# Patient Record
Sex: Male | Born: 1960
Health system: Southern US, Community
[De-identification: ages and names within clinical notes are randomized; demographics above are authoritative.]

## PROBLEM LIST (undated history)

## (undated) DIAGNOSIS — K5792 Diverticulitis of intestine, part unspecified, without perforation or abscess without bleeding: Secondary | ICD-10-CM

## (undated) HISTORY — PX: COLOSTOMY: SHX63

## (undated) HISTORY — PX: TONSILLECTOMY: SUR1361

## (undated) HISTORY — PX: HERNIA REPAIR: SHX51

---

## 2003-06-25 ENCOUNTER — Inpatient Hospital Stay (HOSPITAL_COMMUNITY): Admission: AD | Admit: 2003-06-25 | Discharge: 2003-07-06 | Payer: Self-pay | Admitting: General Surgery

## 2003-06-25 ENCOUNTER — Encounter: Payer: Self-pay | Admitting: General Surgery

## 2003-07-31 ENCOUNTER — Emergency Department (HOSPITAL_COMMUNITY): Admission: EM | Admit: 2003-07-31 | Discharge: 2003-07-31 | Payer: Self-pay | Admitting: *Deleted

## 2003-08-03 ENCOUNTER — Encounter: Payer: Self-pay | Admitting: Urology

## 2003-08-03 ENCOUNTER — Ambulatory Visit (HOSPITAL_COMMUNITY): Admission: RE | Admit: 2003-08-03 | Discharge: 2003-08-03 | Payer: Self-pay | Admitting: Urology

## 2003-08-05 ENCOUNTER — Emergency Department (HOSPITAL_COMMUNITY): Admission: EM | Admit: 2003-08-05 | Discharge: 2003-08-05 | Payer: Self-pay | Admitting: Emergency Medicine

## 2003-08-08 ENCOUNTER — Emergency Department (HOSPITAL_COMMUNITY): Admission: EM | Admit: 2003-08-08 | Discharge: 2003-08-08 | Payer: Self-pay | Admitting: Emergency Medicine

## 2003-08-18 ENCOUNTER — Emergency Department (HOSPITAL_COMMUNITY): Admission: EM | Admit: 2003-08-18 | Discharge: 2003-08-18 | Payer: Self-pay | Admitting: Emergency Medicine

## 2006-06-12 ENCOUNTER — Emergency Department (HOSPITAL_COMMUNITY): Admission: EM | Admit: 2006-06-12 | Discharge: 2006-06-12 | Payer: Self-pay | Admitting: Emergency Medicine

## 2006-11-15 ENCOUNTER — Ambulatory Visit (HOSPITAL_COMMUNITY): Admission: RE | Admit: 2006-11-15 | Discharge: 2006-11-15 | Payer: Self-pay | Admitting: Family Medicine

## 2007-11-21 ENCOUNTER — Inpatient Hospital Stay (HOSPITAL_COMMUNITY): Admission: EM | Admit: 2007-11-21 | Discharge: 2007-11-23 | Payer: Self-pay | Admitting: Emergency Medicine

## 2007-11-21 ENCOUNTER — Ambulatory Visit (HOSPITAL_COMMUNITY): Admission: RE | Admit: 2007-11-21 | Discharge: 2007-11-21 | Payer: Self-pay | Admitting: Family Medicine

## 2011-03-17 NOTE — Discharge Summary (Signed)
Scott Garrison, Scott Garrison             ACCOUNT NO.:  [id number]   MEDICAL RECORD NO.:  [id number]          PATIENT TYPE:  INP   LOCATION:  A320                          FACILITY:  [facility]   PHYSICIAN:  Tilford Pillar, MD      DATE OF BIRTH:  May 11, 1961   DATE OF ADMISSION:  11/21/2007  DATE OF DISCHARGE:  01/21/2009LH                               DISCHARGE SUMMARY   ADMISSION DIAGNOSIS:  Partial small bowel obstruction.   DISCHARGE DIAGNOSES:  1. Status post partial small bowel obstruction.  2. History of diverticulosis/diverticulitis.  3. History of Hartmann's colostomy.   SURGEON:  Tilford Pillar, MD.   PROCEDURE:  None.   DISPOSITION:  Home.   HISTORY/PHYSICAL:  Please see the admission history and physical for the  complete H&P.  The patient is a 50 year old male with a history of a  previous sigmoid colectomy for what appeared to be an episode of  diverticulitis.  He has an end colostomy which he has had for several  years.  He has increasing nausea and vomiting prior to admission with  increasing abdominal pain consistent with a small bowel obstruction.  As  patient has had continued flatus through his ostomy, this was determined  to be likely a partial small bowel obstruction.  He was admitted for  planned management.   HOSPITAL COURSE:  The patient was admitted on November 21, 2007.  Conservative management was initiated with bowel rest, IV fluid  hydration.  NG tube was not placed initially.  However, it was discussed  with the patient that should his nausea, vomiting and abdominal pain  continue, that this may be beneficial.  However during his course, he  continued to have improvement with resolution of his abdominal pain and  with resolution of his nausea and vomiting.  Again, he had increasing  stooling from his ostomy.  On November 23, 2007, he was tolerating a  regular diet, having bowel function and was comfortable.  Plans were  made for discharge.   DISCHARGE  INSTRUCTIONS:  1. The patient is to resume all activities at home.  There are no      restrictions.  2. He is to continue with standard ostomy care.  3. He is to follow up in my office in 2 weeks for post-hospitalization      check.   DISCHARGE MEDICATIONS:  The patient is to resume all previously  prescribed home medications including MiraLax as needed for  constipation.      Tilford Pillar, MD  Electronically Signed     BZ/MEDQ  D:  11/23/2007  T:  11/23/2007  Job:  [id number]   cc:   Tilford Pillar, MD  Fax: [phone]

## 2011-03-17 NOTE — H&P (Signed)
NAMEWASHINGTON, Scott Garrison             ACCOUNT NO.:  [id number]   MEDICAL RECORD NO.:  [id number]          PATIENT TYPE:  INP   LOCATION:  A320                          FACILITY:  [facility]   PHYSICIAN:  Tilford Pillar, MD      DATE OF BIRTH:  1961/04/12   DATE OF ADMISSION:  11/21/2007  DATE OF DISCHARGE:  LH                              HISTORY & PHYSICAL   CHIEF COMPLAINT:  Diffuse abdominal pain and nausea and vomiting.   HISTORY OF PRESENT ILLNESS:  The patient is a 50 year old male with  history of prior diverticulitis, prior sigmoid colectomy with end  colostomy who presents with approximately five days of increasing  abdominal distention, nausea, vomiting, and abdominal pain diffuse in  nature and colicky in its characteristics.  No significant relieving or  exacerbating features noted.  He denies any hematemesis.  He denies any  melena or hematochezia prior to the onset.  He has not noticed any  bulging or hernias.  He has denied any fever or chills.  To note patient  did state he had a couple oranges prior to the onset of all his  symptoms.  He is stating that approximately several years ago he had a  similar blockage after eating oranges, and patient feels that this may  be contributing.  Also to note the patient has had a colonoscopy within  the last five years which was negative at the time of colonoscopy for  any neoplasms or any premalignant changes.   PAST MEDICAL HISTORY:  Diverticulosis, diverticulitis.   PAST SURGICAL HISTORY:  Open sigmoid colectomy with Hartmann's  procedure.   MEDICATIONS:  MiraLax.   ALLERGIES:  No known drug allergies.   SOCIAL HISTORY:  No tobacco abuse, no alcohol use, no recreational drug  use.   REVIEW OF SYSTEMS:  No headaches, no weight changes, no shortness of  breath, no chest pain, no extremity edema.  Abdominal pain as per HPI.  SKIN:  No rashes.   PHYSICAL EXAMINATION:  VITAL SIGNS:  Were reviewed.  Temperature 98.6,  heart rate  80, respirations 18.  GENERAL APPEARANCE:  The patient was initially __________  and  encountered in the hall initially ambulatory in no acute distress.  He  comfortably is able to sit on his bed.  He is alert and oriented x3.  HEENT:  Pupils are equal, round, reactive.  Extraocular movements are  intact.  No conjunctival pallor is noted.  NECK:  Trachea is midline, no cervical lymphadenopathy appreciated.  PULMONARY:  Unlabored respirations clear to auscultation in bilateral  lung fields.  CARDIOVASCULAR:  Regular rate and rhythm.  No murmurs, gallops or rubs  are appreciated.  He has 2+ radial pulses bilaterally, 2+ dorsalis pedis  pulses bilaterally.  ABDOMEN:  He does have positive bowel sounds, abdomen is soft.  While  standing he was somewhat protuberant, but flat lying supine.  No tympany  is encountered.  He has no tenderness to palpation.  Abdomen does have a  midline incisional scar.  On the superior aspect of this there is a  small hernia defect, but no incarcerated  tissue is noted within.  He  also has an ostomy with an ostomy appliance secured.  The ostomy is  pink.  There is some gas initially upon removing the ostomy appliance.  EXTREMITIES:  Warm and dry.   PERTINENT LABORATORY DATA/RADIOGRAPHIC STUDIES:  CBC:  The patient has a  white blood cell count of 7.5, hemoglobin 15.  Basic metabolic panel is  within normal limits as was magnesium phosphorus levels.   CT evaluation of the abdomen and pelvis demonstrates no free air or free  fluid.  There are moderately dilated loops of small intestine.  No  discrete transition point is evident.   ASSESSMENT/PLAN:  A partial small bowel obstruction.  As patient has had  continued flatus and resolution of symptoms with initial bowel rest, it  is likely that this will resolve with continued conservative management  as he currently has an __________ increasing amount of flatus.  At this  point I will advance him to a clear diet.   I will discuss with the  patient should he develop any abdominal pain or nausea that he should  cease any additional oral intake.  Likely progressions were discussed  with the patient including resolution with conservative management  versus a prolonged course or worsening of his symptoms which would  require an operation.  Signs, symptoms, and indications of operation was  discussed with the patient.  He understands that this probably will  continue with planned conservative management.  He is encouraged to  ambulate.  IV fluid was not continued, and he was started on clear  liquids as tolerated.  This may be advanced to full liquids if he  continues to tolerate, and continues to have __________ bowel function  __________  were discussed with the patient that advancing to a  __________ diet beyond __________  will require actual demonstration of  bowel movements through his ostomy.  The patient understands this and is  comfortable with planned course.      Tilford Pillar, MD  Electronically Signed     BZ/MEDQ  D:  11/22/2007  T:  11/22/2007  Job:  [id number]

## 2011-03-20 NOTE — Discharge Summary (Signed)
Scott Garrison, Scott Garrison                         ACCOUNT NO.:  [id number]   MEDICAL RECORD NO.:  [id number]                  PATIENT TYPE:   LOCATION:                                       FACILITY:   PHYSICIAN:  Barbaraann Barthel, M.D.              DATE OF BIRTH:   DATE OF ADMISSION:  DATE OF DISCHARGE:                                 DISCHARGE SUMMARY   ADDENDUM:  This patient's intraoperative complications were discussed  thoroughly with the patient and with his family, and all understood the  nature of his surgical problems.     ___________________________________________                                         Barbaraann Barthel, M.D.   WB/MEDQ  D:  10/01/2003  T:  10/01/2003  Job:  [id number]

## 2011-03-20 NOTE — H&P (Signed)
   NAME:  Scott Garrison, Scott Garrison                       ACCOUNT NO.:  [id number]   MEDICAL RECORD NO.:  [id number]                   PATIENT TYPE:  AMB   LOCATION:  DAY                                  FACILITY:  [facility]   PHYSICIAN:  Ky Barban, M.D.            DATE OF BIRTH:  [birth year]   DATE OF ADMISSION:  08/02/2003  DATE OF DISCHARGE:                                HISTORY & PHYSICAL   CHIEF COMPLAINT:  Recurrent urinary retention.   HISTORY:  [age]-year-old gentleman underwent repair of ureteral injury  on June 27, 2003.  Her was having Hartmann pouch removed and the ureteral  injury was recognized which was repaired by Dr. Rito Ehrlich.  Twice he has been  given voiding trial and he is unable to void.  He told me he was voiding  fine the night before then next day he came to the emergency room with  retention, we placed a Foley catheter, sent him back to his home.  I have  told him that we need to do cystoscopy retrograde urethrogram and see what  is going on.  He understands and wanted me to go ahead and proceed.  For his  remaining history and physical refer to his old chart.   PHYSICAL EXAMINATION:  VITAL SIGNS:  Blood pressure 130/80, temperature is  normal.  CENTRAL NERVOUS SYSTEM:  Negative.  HEAD/NECK/EYE/ENT:  Negative.  CHEST:  Clear and equal.  HEART:  Regular sinus rhythm.  ABDOMEN:  Abdomen soft, flat.  Liver, spleen, and kidneys are not palpable.  EXTERNAL GENITALIA:  He has Foley catheter in place.  Testicles are normal.  He has a nonpermanent colostomy.   IMPRESSION:  Recurrent urinary retention.   PLAN:  Retrograde urethrogram and cystoscopy, possible optical urethrotomy  under anesthesia as outpatient.     ___________________________________________                                         Ky Barban, M.D.   MIJ/MEDQ  D:  08/02/2003  T:  08/02/2003  Job:  [id number]

## 2011-03-20 NOTE — Op Note (Signed)
   NAMEKORT, STETTLER                       ACCOUNT NO.:  [id number]   MEDICAL RECORD NO.:  [id number]                   PATIENT TYPE:  INP   LOCATION:  IC09                                 FACILITY:  [facility]   PHYSICIAN:  Dennie Maizes, M.D.                DATE OF BIRTH:  Jun 12, 1961   DATE OF PROCEDURE:  06/27/2003  DATE OF DISCHARGE:                                 OPERATIVE REPORT   PREOPERATIVE DIAGNOSIS:  Urethral injury during instrumentation of  Hartmann's pouch.   POSTOPERATIVE DIAGNOSIS:  Urethral injury during instrumentation of  Hartmann's pouch.   PROCEDURE:  Repair of urethral injury.   SURGEON:  Dennie Maizes, M.D.   ANESTHESIA:  General   COMPLICATIONS:  None   DRAINS:  16 French Foley catheter in the bladder.   INDICATIONS FOR PROCEDURE:  This 50 year old male was scheduled to undergo  reversal of colostomy by Dr. Malvin Johns. During instrumentation of the  Hartmann's pouch, a rectal injury was sustained. Dr. Malvin Johns decided to do  excision of the Hartmann's pouch.  Urethral injury was recognized during  mobilization of the rectum and was repaired.   DESCRIPTION OF PROCEDURE:  Please refer to Dr. Daisy Blossom operative notes  for the details of the excision of the Hartmann's pouch. Through the hernial  incision Dr. Malvin Johns mobilized the rectum.  During this process he noticed  a urethral injury and he could see the Foley catheter.  I was asked to  repair the urethral injury.   The perineal incision was inspected.  The catheter could be palpated through  a small laceration in the bulbous urethra.  The perineal wound was then  retracted and under vision, a tear in the bulbous urethra about 1 cm in  length was noted. This was a transverse tear in the bulbous urethra  involving the ventral wall of the urethra.  The dorsal wall of the urethra  was found to be intact.  With adequate exposure the bulbous urethra was seen  and the edges of  the urethra were  approximated using 4-0 chromic gut.  The Foley catheter was  then irrigated and there was no leak of urine through the bladder up through  the urethral tear. Dr. Malvin Johns then proceeded with the rest of the  operation.   It is planned to keep the Foley catheter for about 2 weeks for adequate  healing of the urethral injury.                                               Dennie Maizes, M.D.    SK/MEDQ  D:  06/27/2003  T:  06/28/2003  Job:  [id number]   cc:   Barbaraann Barthel, M.D.  [address]  [place]  [place] [address]  Fax: [phone]

## 2011-03-20 NOTE — H&P (Signed)
NAMEJEYSON, DESHOTEL                       ACCOUNT NO.:  [id number]   MEDICAL RECORD NO.:  [id number]                   PATIENT TYPE:  INP   LOCATION:  A305                                 FACILITY:  [facility]   PHYSICIAN:  Barbaraann Barthel, M.D.              DATE OF BIRTH:  [birth year]   DATE OF ADMISSION:  06/25/2003  DATE OF DISCHARGE:                                HISTORY & PHYSICAL   NOTE:  This is a 50 year old white male who underwent a Hartmann procedure  for a perforated diverticular disease in August 2001.  He presents now with  a stenotic colostomy and with desire for colostomy reversal.  He has been  somewhat lost to follow up as he had not kept his appointments as scheduled.  His colostomy has been functioning well, however, it is very stenotic and  hardly allows the passage of the small finger.  He has had no other  problems, however, since this time.  His wound, however, does show that he  has an incisional hernia that has healed without the sign of infection.  We  had planned for an elective colon resection and primary anastomosis with  takedown with his end colostomy.   We discussed the procedure, in detail, with the physical therapy including  complications not limited to but including bleeding, infection, and  anastomotic leaks.  Informed consent was obtained.   PAST MEDICAL HISTORY:  Past medical history is fairly unremarkable.   MEDICATIONS:  He takes no medications on a regular basis.   ALLERGIES:  He has no known allergies.   PAST SURGICAL HISTORY:  He had a T&A done in childhood; and, as stated, he  had an emergency laparotomy with a Hartmann procedure in August 2001.  The  patient is a nonsmoker and a nondrinker.   PHYSICAL EXAMINATION:  VITAL SIGNS:  He is 5 feet 10 inches and weighs 234-  1/2 pounds.  His temperature is 97.8; heart rate is 68; respirations are 20;  blood pressure 122/84.  HEENT:  Head is normocephalic.  Eyes extraocular movements are  intact.  Pupils are round and react to light and accommodation.  There is no  conjunctivae pallor or scleral injection. The sclerae have a normal  tincture.  Nose and oral mucosa are moist.  There are no bruits auscultated  and no adenopathy.  CHEST:  Clear to both anterior and posterior auscultation.  HEART:  Regular rhythm.  ABDOMEN:  Soft. The patient has an incisional hernia in the midline from his  previous surgery.  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  GENITOURINARY:  He has no groin hernias and genitalia is within normal  limits.  RECTAL:  Rectal stump will be evaluated during the time of surgery.  He has  had no complaints in that area.  EXTREMITIES:  Within normal limits.   REVIEW OF  SYSTEMS:  NEUROLOGIC SYSTEM: No history of migraines or seizure.  ENDOCRINE SYSTEM:  No history of diabetes or thyroid disease.  CARDIOPULMONARY SYSTEM: Within normal limits.  MUSCULOSKELETAL SYSTEM: The  patient is somewhat obese.  GI SYSTEM: No past history of hepatitis,  constipation, diarrhea, or bright red rectal bleeding, or any past history  of melena or inflammatory bowel disease, and no unexplained weight loss.  The patient has a history of diverticular disease which required emergency  surgery and a Hartmann procedure performed in 2001.  GU SYSTEM: No history  of dysuria, hematuria, or history of nephrolithiasis.   ADMITTING DIAGNOSES:  1. Status post Hartmann procedure for diverticular disease.  2. Incisional hernia.   PLAN:  We will admit this patient as he is somewhat unreliable regarding his  care to the hospital, and it is somewhat difficult to perform a prep with  him because of the stenosis of his colostomy.  We will do an in hospital  mechanical and antibiotic prep with plans for surgery during this  hospitalization.  During this hospitalization we will also ask Dr. Nobie Putnam  who is his family physician to follow him  medically.                                               Barbaraann Barthel, M.D.    WB/MEDQ  D:  06/25/2003  T:  06/25/2003  Job:  [id number]   cc:   Patrica Duel, M.D.  [address]  [place]  [place] [address]  Fax: [phone]

## 2011-03-20 NOTE — Op Note (Signed)
Scott Garrison, Scott Garrison                       ACCOUNT NO.:  [id number]   MEDICAL RECORD NO.:  [id number]                   PATIENT TYPE:  INP   LOCATION:  A305                                 FACILITY:  [facility]   PHYSICIAN:  Barbaraann Barthel, M.D.              DATE OF BIRTH:  06-20-1961   DATE OF PROCEDURE:  06/27/2003  DATE OF DISCHARGE:                                 OPERATIVE REPORT   PREOPERATIVE DIAGNOSES:  1. Status post Hartmann procedure for diverticular disease.  2. Incisional hernia.   POSTOPERATIVE DIAGNOSES:  1. Status post Hartmann procedure for diverticular disease.  2. Incisional hernia.   PROCEDURE:  1. Abdominal perineal resection with colostomy revision.  2. Repair of urethral tear.  3. Repair of incisional hernia.  4. Failed attempt at [facility] procedure closure.   SURGEON:  Barbaraann Barthel, M.D.   ASSISTANT:  Dennie Maizes, M.D.   COMPLICATIONS:  Urethral tear and low posterior rectal tear.   NOTE:  This is a 50 year old white male who presented to me 3 years ago in  extremis with perforated diverticular disease.  He had a very low  perforation below the sacral promontory. He was treated with a Hartmann  procedure at that time and we made plans for a reversal; however, he was  somewhat lost to follow up as he did not keep his follow up appointments. I  saw him in my office and he stated that his insurance was changing and he  would like to get this fixed prior to an insurance change.  He was also  noted to have a very stenotic colostomy as well as a ventral hernia.  We  discussed the procedure in detail with him, discussing the complications,  not limited to, but including: Bleeding, infection, and anastomotic leaks.  We also discussed the possibility that we may not be able to put him back  together.  An informed consent was obtained.   GROSS OPERATIVE FINDINGS:  The patient had a rectal stump just below the  level of the sacral promontory.  A  stenotic colostomy which we were able to  free up the left colon enough to migrate down into the area of the pelvis to  attempt an EEA anastomosis.  The patient also had an incisional hernia and  multiple adhesions.   SPECIMENS:  1. Left colon with perianal area.  2. Colostomy revision tissue.   DESCRIPTION OF PROCEDURE:  The patient was placed in the supine position and  after the adequate administration of general anesthesia by endotracheal  intubation his entire abdomen was prepped with Betadine solution and draped  in the usual manner; and a Foley catheter was aseptically inserted.  After  prepping and draping I excised an elliptical incision around the colostomy  and then stapled across the colostomy stump and sent this as a specimen.  Then, placed a sterile OpSite over the area after prepping this and then  entire rest of the abdomen was then prepped and draped in the usual manner.  A midline incision was carried out from the xiphoid down to the symphysis  pubis opening the abdomen in the midline being careful not to injure any  bowel due to the fact that there was a considerable incisional hernia  present.   After dissecting free we were able to identify the rectal stump and this was  freed up by elevating it somewhat; however, it was still below the sigmoid  promontory.  We then dissected tediously to the left colon, freed up the  splenic flexure in order to bring the left colon down into the pelvis to  attempt an anastomosis.  We then placed the EEA stapler through the rectum  and unfortunately the EEA stapler tore posteriorly and very low just above  the dentate line tearing the posterior rectum substantially.  We then, could  not suture this closed and I made the decision to revise his colostomy and  then remove the small rectal stump area.  This was done; and in the process  of the perineal portion of the procedure, making an elliptical incision  around the anal area there  was a small tear made in the urethra.  This was  repaired by the urologist Dr. Rito Ehrlich.  For details of this see his  operative note.   We did freed up the rectum.  I then removed that portion of the specimen  through the perineal incision and then placed a Jackson-Pratt drain in the  perineal area and then closed this with 4-0 Nylon and sutured the Al Pimple drain which had been placed through a separate stab wound incision to  the left buttock area.   The incisional hernia was closed with #1 Prolene suture, suturing the  midline with figure-of-eight sutures and then closing the skin with a  stapling device and placing a sterile OpSite dressing over the wound.   The colostomy was then matured prior to closing the abdomen.  It had been  tacked circumferentially to the peritoneum with 3-0 silk sutures and then we  matured the colostomy with 4-0 chromic and then placed a colostomy bag over  this.   Prior to closure all sponge, needle, and instrument counts were found to be  correct.  Estimated blood loss was approximately 600 cc; and the patient  received 6 liters of crystalloids and 500 cc of Hespan intraoperatively.  Follow up H&H's revealed an H&H intraoperatively of 14 and 41; further  follow up H&H's will be made.  Prior to closure all sponge, needle, and  instrument counts were found to be correct.  Estimated blood loss was as  mentioned above.  The patient was taken to the recovery room in satisfactory  condition.   I discussed the operation in detail with all family members and discussed  briefly with the patient in the recovery room that we were unable to  reanastomose him.                                               Barbaraann Barthel, M.D.    WB/MEDQ  D:  06/27/2003  T:  06/27/2003  Job:  [id number]   cc:   Dennie Maizes, M.D.  [address]  [place]  [place] [address]  Fax: [phone]   Patrica Duel, M.D. Sallyanne Havers Sena[address] Ores  [address]  [place]  [place]  [address]  Fax: [phone]

## 2011-03-20 NOTE — Discharge Summary (Signed)
Scott Garrison, SAUCEDA                       ACCOUNT NO.:  [id number]   MEDICAL RECORD NO.:  [id number]                   PATIENT TYPE:  INP   LOCATION:  A219                                 FACILITY:  [facility]   PHYSICIAN:  Barbaraann Barthel, M.D.              DATE OF BIRTH:  07-16-1961   DATE OF ADMISSION:  06/25/2003  DATE OF DISCHARGE:  07/06/2003                                 DISCHARGE SUMMARY   DIAGNOSES:  1. Status post Hartmann's procedure.  2. Incisional hernia.  3. Obesity.  4. Stenosis of colostomy.   PROCEDURE ATTEMPTED:  1. Sigmoid-low anterior anastomosis, status post Hartmann's procedure.  2. Abdominoperineal resection.  3. Repair of urethral tear.  4. Incisional herniorrhaphy.   PROCEDURE:  This procedure was done on June 27, 2003.   CONSULTATIONS:  Dr. Dennie Maizes, urology department.   NOTE:  This is a 50 year old white male who I saw in extremis with  perforated diverticular disease and underwent a Hartmann's procedure in  August of 2001.  At that time, he had a perforated sigmoid colon and an  intra-abdominal abscess.  He had been somewhat lost to followup and returned  with a stenotic colostomy and with plans for reversal; it was also noted  that he had an incisional hernia.   He was admitted to the hospital prior to surgery for his mechanical and  antibiotic prep; this was deemed necessary as the patient was somewhat  reliable; also, mechanically this was difficult due to a stenotic colostomy.  He went to surgery on  June 27, 2003, at which time we tried to perform a  reversal of his Hartmann's; this was very difficult due to the patient's  body habitus and the fact that he had a quite low perforation and in trying  to do so, we were unable to perform an EEA anastomosis; it caused a rectal  tear just in the posterior aspect through just above the dentate line. We  were unable to perform this anastomosis and an abdominoperineal resection  was  carried out.  He also during this had a urethral tear which was  recognized and repaired primarily by the urology service.  This was a very  difficult surgery and the patient was postoperatively placed in the  intensive care unit, where he did fairly well.  He did require a transfusion  of two units of packed red blood cells and he also had a minor wound  dehiscence which did not involve the fascial layer; this was taken care of  in the intensive care unit.  The patient postoperatively did well, apart  from some anxiety within the intensive care unit, however, he did quite well  and he was discharged from the intensive care unit on about the 5th  postoperative day.  At that time, his perineal drain was removed, his  perineal incision was clean and we had no further problems with his  abdominal incision.  His GI tract returned to function with good colostomy  function and his colostomy and stoma site remained in good shape and viable  and he was tolerating p.o. well and he remained with his Foley in place, to  be followed by the urology service postoperatively.  At the time of  discharge, his Foley was draining clear urine, his wound was clean without  any signs of infection, either on the perineal site or the abdominal site,  and he was tolerating p.o. well and was having no shortness of breath or  other problems.   LABORATORY DATA:  The patient's hemoglobin and hematocrit remained stable  after transfusion with a hemoglobin and hematocrit of 12.4 and 36.1 on  July 05, 2003.  His white count on July 03, 2003 was 13.2.  His  electrolytes were grossly within normal limits.   His chest x-ray done on June 25, 2003 was normal.  He did have an EKG  which showed some sinus bradycardia postoperatively, however, he developed  no cardiac problems and was followed by the medical service perioperatively.   DISCHARGE INSTRUCTIONS:  The patient is excused from work.  He is discharged  on a  full liquid and soft diet.  He is told to increase his activities as  tolerated.  He is permitted to shower.  He is told to do no driving, no  heavy lifting or sexual activity in the immediate postoperative period.  He  is told to use Karaya powder around the colostomy site as needed and MiraLax  17 g daily as needed for his stools.  He is told to clean his wounds with  alcohol three times a day and is given a prescription for Darvocet-N 100 one  tab every four hours as needed for pain and he is to follow up with Dr.  Rito Ehrlich for his Foley catheter and he was to follow up with me  perioperatively.  We will be following this patient closely postoperatively.     ___________________________________________                                         Barbaraann Barthel, M.D.   WB/MEDQ  D:  10/01/2003  T:  10/01/2003  Job:  [id number]   cc:   Dennie Maizes, M.D.  [address]  [place]  [place] [address]  Fax: [phone]   Patrica Duel, M.D.  [address]  [place]  [place] [address]  Fax: [phone]

## 2011-03-20 NOTE — Op Note (Signed)
   NAME:  Scott Garrison, Scott Garrison                       ACCOUNT NO.:  [id number]   MEDICAL RECORD NO.:  [id number]                   PATIENT TYPE:  AMB   LOCATION:  DAY                                  FACILITY:  [facility]   PHYSICIAN:  Ky Barban, M.D.            DATE OF BIRTH:  09-Sep-1961   DATE OF PROCEDURE:  DATE OF DISCHARGE:                                 OPERATIVE REPORT   PREOPERATIVE DIAGNOSIS:  Urinary retention, rule out urethral stricture.   POSTOPERATIVE DIAGNOSIS:  Questionable urethral stricture and urethritis.   PROCEDURE:  Retrograde urethrogram, cystoscopy and dilation.   ANESTHESIA:  General.   DESCRIPTION OF PROCEDURE:  The patient was given general endotracheal  anesthesia, placed in lithotomy position, __________ prep and drape. A  solution of Hypaque and KY jelly was injected with the help of a Toomey  syringe into the urethra and the fluoroscopic urethrogram is done. The dye  goes through the bulbous urethra and I do not see any significant  obstruction or narrowing in that area. I could not take oblique picture. The  patient is already in lithotomy position. I was satisfied with the  urethrogram so I did not try to do an oblique picture. The #35 cystoscope  was introduced under direct vision. The bulbous urethra where the  urethrotomy happened and a repair was done. That area appeared slightly  inflamed and irregular, otherwise, my cystoscope goes through it without any  obstruction. The prostatic urethra looks fine, bladder looks fine. The  cystoscope was removed, the urethra was further dilated to a 85 [place]  without any problem. I did not see any definite stricture but it does appear  that area of the bulbous urethra is slightly irregular and may be tight but  I was able to dilate up to 28 [place] without any problem. I did not see that  he needed a urethrotomy. I left a #18 silicone catheter. The patient left  the operating room in satisfactory  condition.      ___________________________________________                                            Ky Barban, M.D.   MIJ/MEDQ  D:  08/03/2003  T:  08/03/2003  Job:  [id number]   cc:   Barbaraann Barthel, M.D.  [address]  [place]  [place] [address]  Fax: [phone]

## 2011-07-23 LAB — MAGNESIUM: Magnesium: 2

## 2011-07-23 LAB — APTT: aPTT: 26

## 2011-07-23 LAB — BASIC METABOLIC PANEL
BUN: 12
BUN: 14
CO2: 25
CO2: 26
Calcium: 10.4
Calcium: 9.2
Chloride: 103
Chloride: 105
Creatinine, Ser: 1.21
Creatinine, Ser: 1.21
GFR calc Af Amer: >60
GFR calc Af Amer: >60
GFR calc non Af Amer: >60
GFR calc non Af Amer: >60
Glucose, Bld: 108 — ABNORMAL HIGH
Glucose, Bld: 93
Potassium: 3.9
Potassium: 4
Sodium: 134 — ABNORMAL LOW
Sodium: 138

## 2011-07-23 LAB — DIFFERENTIAL
Basophils Absolute: 0
Basophils Absolute: 0
Basophils Relative: 0
Basophils Relative: 1
Eosinophils Absolute: 0
Eosinophils Absolute: 0.1
Eosinophils Relative: 0
Eosinophils Relative: 1
Lymphocytes Relative: 10 — ABNORMAL LOW
Lymphocytes Relative: 5 — ABNORMAL LOW
Lymphs Abs: 0.5 — ABNORMAL LOW
Lymphs Abs: 0.7
Monocytes Absolute: 0.7
Monocytes Absolute: 0.8
Monocytes Relative: 11
Monocytes Relative: 7
Neutro Abs: 5.8
Neutro Abs: 9.5 — ABNORMAL HIGH
Neutrophils Relative %: 78 — ABNORMAL HIGH
Neutrophils Relative %: 88 — ABNORMAL HIGH

## 2011-07-23 LAB — CBC
HCT: 45.9
HCT: 49.1
Hemoglobin: 15.3
Hemoglobin: 16.4
MCHC: 33.2
MCHC: 33.4
MCV: 100.2 — ABNORMAL HIGH
MCV: 99.8
Platelets: 223
Platelets: 238
RBC: 4.59
RBC: 4.92
RDW: 12.7
RDW: 12.7
WBC: 10.7 — ABNORMAL HIGH
WBC: 7.5

## 2011-07-23 LAB — PHOSPHORUS: Phosphorus: 3.5

## 2011-07-23 LAB — PROTIME-INR
INR: 1
Prothrombin Time: 13.4

## 2016-05-12 ENCOUNTER — Emergency Department (HOSPITAL_COMMUNITY)
Admission: EM | Admit: 2016-05-12 | Discharge: 2016-05-12 | Disposition: A | Discharge status: Home / Self Care | Payer: Medicare Other | Attending: Emergency Medicine | Admitting: Emergency Medicine

## 2016-05-12 ENCOUNTER — Emergency Department (HOSPITAL_COMMUNITY): Payer: Medicare Other

## 2016-05-12 ENCOUNTER — Encounter (HOSPITAL_COMMUNITY): Payer: Self-pay | Admitting: Emergency Medicine

## 2016-05-12 DIAGNOSIS — Y929 Unspecified place or not applicable: Secondary | ICD-10-CM | POA: Diagnosis not present

## 2016-05-12 DIAGNOSIS — Y999 Unspecified external cause status: Secondary | ICD-10-CM | POA: Diagnosis not present

## 2016-05-12 DIAGNOSIS — R109 Unspecified abdominal pain: Secondary | ICD-10-CM | POA: Insufficient documentation

## 2016-05-12 DIAGNOSIS — Y939 Activity, unspecified: Secondary | ICD-10-CM | POA: Insufficient documentation

## 2016-05-12 HISTORY — DX: Diverticulitis of intestine, part unspecified, without perforation or abscess without bleeding: K57.92

## 2016-05-12 LAB — BASIC METABOLIC PANEL
Anion gap: 6 (ref 5–15)
BUN: 13 mg/dL (ref 6–20)
CO2: 26 mmol/L (ref 22–32)
Calcium: 9.8 mg/dL (ref 8.9–10.3)
Chloride: 105 mmol/L (ref 101–111)
Creatinine, Ser: 0.99 mg/dL (ref 0.61–1.24)
GFR calc Af Amer: >60 mL/min (ref >60)
GFR calc non Af Amer: >60 mL/min (ref >60)
Glucose, Bld: 117 mg/dL — ABNORMAL HIGH (ref 65–99)
Potassium: 3.8 mmol/L (ref 3.5–5.1)
Sodium: 137 mmol/L (ref 135–145)

## 2016-05-12 LAB — CBC WITH DIFFERENTIAL/PLATELET
Basophils Absolute: 0 10*3/uL (ref 0.0–0.1)
Basophils Relative: 1 %
Eosinophils Absolute: 0.1 10*3/uL (ref 0.0–0.7)
Eosinophils Relative: 2 %
HCT: 45.3 % (ref 39.0–52.0)
Hemoglobin: 15.7 g/dL (ref 13.0–17.0)
Lymphocytes Relative: 7 %
Lymphs Abs: 0.6 10*3/uL — ABNORMAL LOW (ref 0.7–4.0)
MCH: 34.1 pg — ABNORMAL HIGH (ref 26.0–34.0)
MCHC: 34.7 g/dL (ref 30.0–36.0)
MCV: 98.5 fL (ref 78.0–100.0)
Monocytes Absolute: 0.9 10*3/uL (ref 0.1–1.0)
Monocytes Relative: 11 %
Neutro Abs: 6.5 10*3/uL (ref 1.7–7.7)
Neutrophils Relative %: 79 %
Platelets: 216 10*3/uL (ref 150–400)
RBC: 4.6 MIL/uL (ref 4.22–5.81)
RDW: 12.7 % (ref 11.5–15.5)
WBC: 8.1 10*3/uL (ref 4.0–10.5)

## 2016-05-12 MED ORDER — SODIUM CHLORIDE 0.9 % IV BOLUS (SEPSIS)
500.0000 mL | Freq: Once | INTRAVENOUS | Status: AC
  Administered 2016-05-12: 500 mL via INTRAVENOUS

## 2016-05-12 MED ORDER — DIATRIZOATE MEGLUMINE & SODIUM 66-10 % PO SOLN
ORAL | Status: AC
  Filled 2016-05-12: qty 30

## 2016-05-12 MED ORDER — IOPAMIDOL (ISOVUE-300) INJECTION 61%
100.0000 mL | Freq: Once | INTRAVENOUS | Status: AC | PRN
Start: 2016-05-12 — End: 2016-05-12
  Administered 2016-05-12: 100 mL via INTRAVENOUS
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
## 2016-05-12 NOTE — Discharge Instructions (Signed)
Take Tylenol or Motrin for pain and follow-up if any problems

## 2016-06-20 ENCOUNTER — Emergency Department (HOSPITAL_COMMUNITY)
Admission: EM | Admit: 2016-06-20 | Discharge: 2016-06-20 | Disposition: A | Discharge status: Home / Self Care | Payer: Medicare Other | Attending: Emergency Medicine | Admitting: Emergency Medicine

## 2016-06-20 ENCOUNTER — Encounter (HOSPITAL_COMMUNITY): Payer: Self-pay | Admitting: *Deleted

## 2016-06-20 DIAGNOSIS — Y939 Activity, unspecified: Secondary | ICD-10-CM | POA: Insufficient documentation

## 2016-06-20 DIAGNOSIS — M549 Dorsalgia, unspecified: Secondary | ICD-10-CM | POA: Diagnosis present

## 2016-06-20 DIAGNOSIS — Y92410 Unspecified street and highway as the place of occurrence of the external cause: Secondary | ICD-10-CM | POA: Insufficient documentation

## 2016-06-20 DIAGNOSIS — S46911A Strain of unspecified muscle, fascia and tendon at shoulder and upper arm level, right arm, initial encounter: Secondary | ICD-10-CM | POA: Diagnosis not present

## 2016-06-20 DIAGNOSIS — Y999 Unspecified external cause status: Secondary | ICD-10-CM | POA: Insufficient documentation

## 2016-06-20 MED ORDER — PREDNISONE 20 MG PO TABS
40.0000 mg | ORAL_TABLET | Freq: Once | ORAL | Status: AC
  Administered 2016-06-20: 40 mg via ORAL
  Filled 2016-06-20: qty 2

## 2016-06-20 MED ORDER — CYCLOBENZAPRINE HCL 10 MG PO TABS: 10.0000 mg | ORAL_TABLET | Freq: Three times a day (TID) | ORAL | 0 refills | Status: DC

## 2016-06-20 MED ORDER — TRAMADOL HCL 50 MG PO TABS
100.0000 mg | ORAL_TABLET | Freq: Once | ORAL | Status: AC
  Administered 2016-06-20: 100 mg via ORAL
  Filled 2016-06-20: qty 2

## 2016-06-20 MED ORDER — CYCLOBENZAPRINE HCL 10 MG PO TABS
10.0000 mg | ORAL_TABLET | Freq: Once | ORAL | Status: AC
  Administered 2016-06-20: 10 mg via ORAL
  Filled 2016-06-20: qty 1

## 2016-06-20 MED ORDER — TRAMADOL HCL 50 MG PO TABS: 50.0000 mg | ORAL_TABLET | Freq: Four times a day (QID) | ORAL | 0 refills | Status: DC | PRN

## 2016-06-20 NOTE — Discharge Instructions (Signed)
Your vital signs within normal limits. Your examination favors strain of your right shoulder. Please use Flexeril 3 times daily, and use Ultram for pain not improved by Tylenol. Please see Dr. Romeo AppleHarrison in the office as soon as possible for orthopedic evaluation of your shoulder.
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
## 2016-06-20 NOTE — ED Notes (Signed)
Hobson, PA at bedside.  
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
## 2016-06-20 NOTE — ED Notes (Signed)
Pt made aware to return if symptoms worsen or if any life threatening symptoms occur.   

## 2016-06-22 ENCOUNTER — Telehealth: Payer: Self-pay | Admitting: Orthopaedic Surgery

## 2016-06-22 NOTE — Telephone Encounter (Signed)
Patient called following Emergency Room visit at [facility] for right shoulder pain/strain; states no Xray was done. Offered first available appointment

## 2016-06-23 ENCOUNTER — Ambulatory Visit (INDEPENDENT_AMBULATORY_CARE_PROVIDER_SITE_OTHER): Payer: Medicare Other

## 2016-06-23 ENCOUNTER — Encounter: Payer: Self-pay | Admitting: Orthopaedic Surgery

## 2016-06-23 ENCOUNTER — Ambulatory Visit (INDEPENDENT_AMBULATORY_CARE_PROVIDER_SITE_OTHER): Payer: Medicare Other | Admitting: Orthopaedic Surgery

## 2016-06-23 VITALS — BP 128/81 | HR 70 | Temp 97.7°F | Ht 69.0 in | Wt 228.0 lb

## 2016-06-23 DIAGNOSIS — M25511 Pain in right shoulder: Secondary | ICD-10-CM
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
## 2016-07-07 ENCOUNTER — Ambulatory Visit: Payer: Medicare Other | Admitting: Orthopaedic Surgery
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
## 2016-11-09 ENCOUNTER — Encounter (HOSPITAL_COMMUNITY): Payer: Self-pay | Admitting: Emergency Medicine

## 2016-11-09 ENCOUNTER — Emergency Department (HOSPITAL_COMMUNITY)
Admission: EM | Admit: 2016-11-09 | Discharge: 2016-11-09 | Disposition: A | Discharge status: Home / Self Care | Payer: Medicare Other | Attending: Emergency Medicine | Admitting: Emergency Medicine

## 2016-11-09 DIAGNOSIS — K9400 Colostomy complication, unspecified: Secondary | ICD-10-CM | POA: Insufficient documentation

## 2016-11-09 DIAGNOSIS — IMO0002 Reserved for concepts with insufficient information to code with codable children: Secondary | ICD-10-CM

## 2016-11-09 DIAGNOSIS — Z4801 Encounter for change or removal of surgical wound dressing: Secondary | ICD-10-CM | POA: Diagnosis present

## 2016-11-09 LAB — I-STAT CHEM 8, ED
BUN: 13 mg/dL (ref 6–20)
Calcium, Ion: 1.2 mmol/L (ref 1.15–1.40)
Chloride: 102 mmol/L (ref 101–111)
Creatinine, Ser: 1 mg/dL (ref 0.61–1.24)
Glucose, Bld: 101 mg/dL — ABNORMAL HIGH (ref 65–99)
HCT: 50 % (ref 39.0–52.0)
Hemoglobin: 17 g/dL (ref 13.0–17.0)
Potassium: 4.3 mmol/L (ref 3.5–5.1)
Sodium: 140 mmol/L (ref 135–145)
TCO2: 25 mmol/L (ref 0–100)

## 2016-11-09 NOTE — ED Triage Notes (Signed)
PT c/o irritation/redness to colostomy stoma and skin surrounding area x2 days. PT denies any abdominal pain.

## 2016-11-09 NOTE — ED Provider Notes (Signed)
[facility]-EMERGENCY DEPT Provider Note   CSN: [id number] Arrival date & time: 11/09/16  1041   By signing my name below, I, Cynda Acres, attest that this documentation has been prepared under the direction and in the presence of Burgess Amor, PA-C. Electronically Signed: Cynda Acres, Scribe. 11/09/16. 12:42 PM.  History   Chief Complaint Chief Complaint  Patient presents with  . Wound Check    HPI Comments: Scott Garrison is a 56 y.o. male with a hx of diverticulitis with 16 year history with a colostomy, complaining of irritation/redness to his colostomy stoma. Patient states he has intermittent episodes where he has frequent colostomy output, yesterday he had to change his colostomy bag 15 times.  When he has these episodes, he tends to rub the colostomy site causing irritation of the stoma.  He denies blood in his stool, but was somewhat startled by the amount of bleeding he saw from the stoma mucosa yesterday, although it is better controlled today.  He describes his stool as soft, not watery. No modifying factors indicated. He does not currently have a pcp, was a patient of Dr. Malvin Johns before he retired.    The history is provided by the patient. No language interpreter was used.    Past Medical History:  Diagnosis Date  . Diverticulitis     There are no active problems to display for this patient.   Past Surgical History:  Procedure Laterality Date  . COLOSTOMY    . HERNIA REPAIR    . TONSILLECTOMY         Home Medications    Prior to Admission medications   Medication Sig Start Date End Date Taking? Authorizing Provider  cyclobenzaprine (FLEXERIL) 10 MG tablet Take 1 tablet (10 mg total) by mouth 3 (three) times daily. Patient not taking: Reported on 11/09/2016 06/20/16   Ivery Quale, PA-C  traMADol (ULTRAM) 50 MG tablet Take 1 tablet (50 mg total) by mouth every 6 (six) hours as needed. Patient not taking: Reported on 11/09/2016 06/20/16   Ivery Quale, PA-C     Family History History reviewed. No pertinent family history.  Social History Social History  Substance Use Topics  . Smoking status: Never Smoker  . Smokeless tobacco: Never Used  . Alcohol use Yes     Comment: socially     Allergies   Patient has no known allergies.   Review of Systems Review of Systems   Physical Exam Updated Vital Signs BP 120/78 (BP Location: Left Arm)   Pulse 85   Temp 98.1 F (36.7 C) (Oral)   Resp 20   Ht 5\' 10"  (1.778 m)   Wt 93.9 kg   SpO2 100%   BMI 29.70 kg/m   Physical Exam  Constitutional: He is oriented to person, place, and time. He appears well-developed and well-nourished.  HENT:  Head: Normocephalic and atraumatic.  Eyes: Conjunctivae and EOM are normal. Pupils are equal, round, and reactive to light.  Neck: Normal range of motion. Neck supple.  Cardiovascular: Normal rate.   Pulmonary/Chest: Effort normal.  Abdominal: Soft. He exhibits no distension. There is no tenderness. There is no rebound and no guarding.  His stoma is erythematous with several areas of erythema and friable areas that look like have been recently bleeding.  There are few traces of bright red blood along the bag wall, otherwise empty of stool.   Musculoskeletal: Normal range of motion.  Neurological: He is alert and oriented to person, place, and time.  Skin: Skin  is warm and dry.  Psychiatric: He has a normal mood and affect.  Nursing note and vitals reviewed.    ED Treatments / Results  DIAGNOSTIC STUDIES: Oxygen Saturation is 100% on RA, normal by my interpretation.    COORDINATION OF CARE: 12:42 PM Discussed treatment plan with pt at bedside and pt agreed to plan.  Labs (all labs ordered are listed, but only abnormal results are displayed) Labs Reviewed  I-STAT CHEM 8, ED - Abnormal; Notable for the following:       Result Value   Glucose, Bld 101 (*)    All other components within normal limits    EKG  EKG Interpretation None        Radiology No results found.  Procedures Procedures (including critical care time)  Medications Ordered in ED Medications - No data to display   Initial Impression / Assessment and Plan / ED Course  I have reviewed the triage vital signs and the nursing notes.  Pertinent labs & imaging results that were available during my care of the patient were reviewed by me and considered in my medical decision making (see chart for details).  Clinical Course     Labs reviewed,  No electolyte abnormality and no anemia from blood loss.  Pt with irritated but otherwise healthy appearing stoma.  He reports he has a prescription "powder" he can use for irritation of the stoma, but has not used (unsure of name).  Pt was given referrals to pcp and also surgery prn.  Also advised return here for any new or worsened bleeding.  The patient appears reasonably screened and/or stabilized for discharge and I doubt any other medical condition or other [facility] requiring further screening, evaluation, or treatment in the ED at this time prior to discharge.   Final Clinical Impressions(s) / ED Diagnoses   Final diagnoses:  Complication of external stoma of gastrointestinal tract    New Prescriptions Discharge Medication List as of 11/09/2016  1:07 PM     I personally performed the services described in this documentation, which was scribed in my presence. The recorded information has been reviewed and is accurate.    Burgess AmorJulie Airika Alkhatib, PA-C 11/11/16 1127    Raeford RazorStephen Kohut, MD 11/13/16 [phone]

## 2017-05-16 ENCOUNTER — Emergency Department (HOSPITAL_COMMUNITY)
Admission: EM | Admit: 2017-05-16 | Discharge: 2017-05-16 | Disposition: A | Discharge status: Home / Self Care | Payer: Medicare Other | Attending: Emergency Medicine | Admitting: Emergency Medicine

## 2017-05-16 ENCOUNTER — Encounter (HOSPITAL_COMMUNITY): Payer: Self-pay | Admitting: Emergency Medicine

## 2017-05-16 DIAGNOSIS — R1110 Vomiting, unspecified: Secondary | ICD-10-CM | POA: Diagnosis present

## 2017-05-16 DIAGNOSIS — R109 Unspecified abdominal pain: Secondary | ICD-10-CM | POA: Diagnosis not present

## 2017-05-16 LAB — CBC WITH DIFFERENTIAL/PLATELET
Basophils Absolute: 0 10*3/uL (ref 0.0–0.1)
Basophils Relative: 0 %
Eosinophils Absolute: 0 10*3/uL (ref 0.0–0.7)
Eosinophils Relative: 1 %
HCT: 43 % (ref 39.0–52.0)
Hemoglobin: 15.3 g/dL (ref 13.0–17.0)
Lymphocytes Relative: 7 %
Lymphs Abs: 0.4 10*3/uL — ABNORMAL LOW (ref 0.7–4.0)
MCH: 35.1 pg — ABNORMAL HIGH (ref 26.0–34.0)
MCHC: 35.6 g/dL (ref 30.0–36.0)
MCV: 98.6 fL (ref 78.0–100.0)
Monocytes Absolute: 0.5 10*3/uL (ref 0.1–1.0)
Monocytes Relative: 8 %
Neutro Abs: 4.7 10*3/uL (ref 1.7–7.7)
Neutrophils Relative %: 84 %
Platelets: 183 10*3/uL (ref 150–400)
RBC: 4.36 MIL/uL (ref 4.22–5.81)
RDW: 12.4 % (ref 11.5–15.5)
WBC: 5.6 10*3/uL (ref 4.0–10.5)

## 2017-05-16 LAB — COMPREHENSIVE METABOLIC PANEL
ALT: 68 U/L — ABNORMAL HIGH (ref 17–63)
AST: 108 U/L — ABNORMAL HIGH (ref 15–41)
Albumin: 4.1 g/dL (ref 3.5–5.0)
Alkaline Phosphatase: 95 U/L (ref 38–126)
Anion gap: 11 (ref 5–15)
BUN: 9 mg/dL (ref 6–20)
CO2: 20 mmol/L — ABNORMAL LOW (ref 22–32)
Calcium: 9.5 mg/dL (ref 8.9–10.3)
Chloride: 106 mmol/L (ref 101–111)
Creatinine, Ser: 0.89 mg/dL (ref 0.61–1.24)
GFR calc Af Amer: >60 mL/min (ref >60)
GFR calc non Af Amer: >60 mL/min (ref >60)
Glucose, Bld: 117 mg/dL — ABNORMAL HIGH (ref 65–99)
Potassium: 3.7 mmol/L (ref 3.5–5.1)
Sodium: 137 mmol/L (ref 135–145)
Total Bilirubin: 1.9 mg/dL — ABNORMAL HIGH (ref 0.3–1.2)
Total Protein: 8.2 g/dL — ABNORMAL HIGH (ref 6.5–8.1)

## 2017-05-16 LAB — CK: Total CK: 195 U/L (ref 49–397)

## 2017-05-16 MED ORDER — ONDANSETRON 4 MG PO TBDP: 4.0000 mg | ORAL_TABLET | Freq: Three times a day (TID) | ORAL | 0 refills | Status: DC | PRN

## 2017-05-16 MED ORDER — SODIUM CHLORIDE 0.9 % IV BOLUS (SEPSIS)
1000.0000 mL | Freq: Once | INTRAVENOUS | Status: AC
  Administered 2017-05-16: 1000 mL via INTRAVENOUS

## 2017-05-16 MED ORDER — LORAZEPAM 2 MG/ML IJ SOLN
1.0000 mg | Freq: Once | INTRAMUSCULAR | Status: AC
  Administered 2017-05-16: 1 mg via INTRAVENOUS
  Filled 2017-05-16: qty 1

## 2017-05-16 NOTE — ED Triage Notes (Signed)
Pt reports working outside for several hours yesterday and drinking soda to rehydrate. Last night started having abd pain and has had two episodes of V/. States he has abd pain and shakiness to hands.

## 2017-05-16 NOTE — Discharge Instructions (Signed)
Rest, increase fluids, prescription for nausea.

## 2017-05-18 NOTE — ED Provider Notes (Signed)
[facility]-EMERGENCY DEPT Provider Note   CSN: [id number] Arrival date & time: 05/16/17  1204     History   Chief Complaint Chief Complaint  Patient presents with  . Emesis    HPI Scott Garrison is a 56 y.o. male.  patient presents with a concern about dehydration. He was working in the hot sun yesterday and did not drink enough fluids. He felt overheated. He was unable to eat much last night. He is urinating. No fever, sweats, chills, chest pain, dyspnea, mental status changes. Severity of symptoms is moderate. Review of systems positive for looseness of right central incisor.      Past Medical History:  Diagnosis Date  . Diverticulitis     There are no active problems to display for this patient.   Past Surgical History:  Procedure Laterality Date  . COLOSTOMY    . HERNIA REPAIR    . TONSILLECTOMY         Home Medications    Prior to Admission medications   Medication Sig Start Date End Date Taking? Authorizing Provider  cyclobenzaprine (FLEXERIL) 10 MG tablet Take 1 tablet (10 mg total) by mouth 3 (three) times daily. Patient not taking: Reported on 11/09/2016 06/20/16   Ivery Quale, PA-C  ondansetron (ZOFRAN ODT) 4 MG disintegrating tablet Take 1 tablet (4 mg total) by mouth every 8 (eight) hours as needed for nausea or vomiting. 05/16/17   Donnetta Hutching, MD  ondansetron (ZOFRAN ODT) 4 MG disintegrating tablet Take 1 tablet (4 mg total) by mouth every 8 (eight) hours as needed for nausea or vomiting. 05/16/17   Donnetta Hutching, MD  traMADol (ULTRAM) 50 MG tablet Take 1 tablet (50 mg total) by mouth every 6 (six) hours as needed. Patient not taking: Reported on 11/09/2016 06/20/16   Ivery Quale, PA-C    Family History History reviewed. No pertinent family history.  Social History Social History  Substance Use Topics  . Smoking status: Never Smoker  . Smokeless tobacco: Never Used  . Alcohol use Yes     Comment: socially     Allergies   Patient has no known  allergies.   Review of Systems Review of Systems  All other systems reviewed and are negative.    Physical Exam Updated Vital Signs BP 128/78 (BP Location: Right Arm)   Pulse 68   Temp 97.8 F (36.6 C) (Oral)   Resp 18   Ht 5\' 10"  (1.778 m)   Wt 86.2 kg (190 lb)   SpO2 98%   BMI 27.26 kg/m   Physical Exam  Constitutional: He is oriented to person, place, and time.  Appears dehydrated  HENT:  Head: Normocephalic and atraumatic.  Right central incisor loose  Eyes: Conjunctivae are normal.  Neck: Neck supple.  Cardiovascular: Normal rate and regular rhythm.   Pulmonary/Chest: Effort normal and breath sounds normal.  Abdominal: Soft. Bowel sounds are normal.  Musculoskeletal: Normal range of motion.  Neurological: He is alert and oriented to person, place, and time.  Skin: Skin is warm and dry.  Psychiatric: He has a normal mood and affect. His behavior is normal.  Nursing note and vitals reviewed.    ED Treatments / Results  Labs (all labs ordered are listed, but only abnormal results are displayed) Labs Reviewed  CBC WITH DIFFERENTIAL/PLATELET - Abnormal; Notable for the following:       Result Value   MCH 35.1 (*)    Lymphs Abs 0.4 (*)    All other components within  normal limits  COMPREHENSIVE METABOLIC PANEL - Abnormal; Notable for the following:    CO2 20 (*)    Glucose, Bld 117 (*)    Total Protein 8.2 (*)    AST 108 (*)    ALT 68 (*)    Total Bilirubin 1.9 (*)    All other components within normal limits  CK    EKG  EKG Interpretation None       Radiology No results found.  Procedures Procedures (including critical care time)  Medications Ordered in ED Medications  sodium chloride 0.9 % bolus 1,000 mL (0 mLs Intravenous Stopped 05/16/17 1454)  sodium chloride 0.9 % bolus 1,000 mL (0 mLs Intravenous Stopped 05/16/17 1454)  LORazepam (ATIVAN) injection 1 mg (1 mg Intravenous Given 05/16/17 1324)     Initial Impression / Assessment and  Plan / ED Course  I have reviewed the triage vital signs and the nursing notes.  Pertinent labs & imaging results that were available during my care of the patient were reviewed by me and considered in my medical decision making (see chart for details).     I suspect patient is suffering from mild heat exhaustion. He feels better after IV hydration. CPK was normal. Discharge medications Zofran 4 mg  Final Clinical Impressions(s) / ED Diagnoses   Final diagnoses:  Abdominal pain, unspecified abdominal location    New Prescriptions Discharge Medication List as of 05/16/2017  2:38 PM    START taking these medications   Details  ondansetron (ZOFRAN ODT) 4 MG disintegrating tablet Take 1 tablet (4 mg total) by mouth every 8 (eight) hours as needed for nausea or vomiting., Starting Sun 05/16/2017, Print         Donnetta Hutchingook, Damarea Merkel, MD 05/18/17 [phone]

## 2017-09-30 ENCOUNTER — Ambulatory Visit (INDEPENDENT_AMBULATORY_CARE_PROVIDER_SITE_OTHER): Payer: Medicare Other | Admitting: Psychology

## 2017-09-30 ENCOUNTER — Encounter (HOSPITAL_COMMUNITY): Payer: Self-pay | Admitting: Psychology

## 2017-09-30 DIAGNOSIS — F1020 Alcohol dependence, uncomplicated: Secondary | ICD-10-CM | POA: Insufficient documentation
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
## 2018-05-10 ENCOUNTER — Ambulatory Visit: Payer: Medicare HMO | Admitting: General Surgery

## 2018-05-10 ENCOUNTER — Encounter: Payer: Self-pay | Admitting: General Surgery

## 2018-05-10 VITALS — BP 126/78 | HR 72 | Temp 97.7°F | Ht 71.0 in | Wt 211.0 lb

## 2018-05-10 DIAGNOSIS — Z933 Colostomy status: Secondary | ICD-10-CM

## 2018-05-10 NOTE — Patient Instructions (Signed)
Colostomy Home Guide, Adult A colostomy is a surgical procedure to make an opening (stoma) for stool (feces) to leave your body. This surgery is done when a medical condition prevents stool from leaving your body through the end of the large intestine (rectum). During the surgery, part of the large intestine (colon) is attached to the stoma that is made in the front of your abdomen. A bag (pouch) is fitted over the stoma. Stool and gas will collect in the bag. After having this surgery, you will need to empty and change your colostomy bag as needed. You will also need to care for the stoma. How do I care for my stoma? Your stoma should look pink, red, and moist, like the inside of your cheek. At first, the stoma may be swollen, but this swelling will go away within 6 weeks. To care for the stoma:  Keep the skin around the stoma clean and dry.  Use a clean, soft washcloth to gently wash the stoma and the skin around it. ? Use warm water and only use cleansers recommended by your health care provider. ? Rinse the stoma area with plain water. ? Dry the area well.  Use stoma powder or ointment on your skin only as told by your health care provider. Do not use any other powders, gels, wipes, or creams on your skin.  Change your colostomy bag if your skin becomes irritated. Irritation may indicate that the bag is leaking.  Check your stoma area every day for signs of infection. Check for: ? More redness, swelling, or pain. ? More fluid or blood. ? Pus or warmth.  Measure the stoma opening regularly and record the size. Watch for changes. Share this information with your health care provider.  How do I care for my colostomy bag? The bag that fits over the stoma can have either one or two pieces.  One-piece bag: The skin barrier and the bag are combined in a single unit.  Two-piece bag: The skin barrier and the bag are separate pieces that attach to each other.  Empty your bag at bedtime and  whenever it is one-third to one-half full. Do not let more stool or gas build up. This could cause the bag to leak. Some colostomy bags have a built-in gas release valve. Change the bag every 3-4 days or as told by your health care provider. Also change the bag if it is leaking or separating from the skin or your skin looks irritated. How do I empty my colostomy bag? Before you leave the hospital, you will be taught how to empty your bag. Follow these basic steps: 1. Wash your hands with soap and water. 2. Sit far back on the toilet. 3. Put several pieces of toilet paper into the toilet water. This will prevent splashing as you empty the stool into the toilet. 4. Remove the clip or the velcro from the tail end of the bag. 5. Unroll the tail, then empty stool into the toilet. 6. Clean the tail with toilet paper. 7. Reroll the tail, and close it with the clip or velcro. 8. Wash your hands again.  How do I change my colostomy bag? Before you leave the hospital, you will be taught how to change your bag. Always have colostomy supplies with you, and follow these basic steps: 1. Wash your hands with soap and water. Have paper towels or tissues near you to clean any discharge. 2. Use a template to pre-cut the skin barrier. Smooth  any rough edges. 3. If using a two-piece bag, attach the bag and the skin barrier to each other. Add the barrier ring, if you use one. 4. If your stools are watery, add a few cotton balls to the new bag to absorb the liquid. 5. Remove the old bag and skin barrier. Gently push the skin away from the barrier with your fingers or a warm cloth. 6. Wash your hands again. Then clean the stoma area as directed with water or with mild soap and water. Use water to rinse away any soap. 7. Dry the skin. You may use the cool setting on a hair dryer to do this. 8. If directed, apply stoma powder or skin barrier gel to the skin. 9. Dry the skin again. 10. Warm the skin barrier with your  hands or a warm compress. 11. Remove the paper from the sticky (adhesive) strip of the skin barrier. 12. Press the adhesive strip onto the skin around the stoma. 13. Gently rub the skin barrier onto the skin. This creates heat that helps the barrier to stick. 14. Apply stoma tape to the edges of the skin barrier.  What are some general tips?  Avoid wearing clothes that are tight directly over your stoma.  You may shower or bathe with the colostomy bag on or off. Do not use harsh or oily soaps or lotions. Dry the skin and bag after bathing.  Store all supplies in a cool, dry place. Do not leave supplies in extreme heat because parts can melt.  Whenever you leave home, take an extra skin barrier and colostomy bag with you.  If your colostomy bag gets wet, you can dry it with a hair dryer on the cool setting.  To prevent odor, put drops of ostomy deodorizer in the colostomy bag. Your health care provider may also recommend putting ostomy lubricant inside the bag. This helps the stool to slide out of the bag more easily and completely. Contact a health care provider if:  You have more redness, swelling, or pain around your stoma.  You have more fluid or blood coming from your stoma.  Your stoma feels warm to the touch.  You have pus coming from your stoma.  Your stoma extends in or out farther than normal.  You need to change the bag every day.  You have a fever. Get help right away if:  Your stool is bloody.  You vomit.  You have trouble breathing. This information is not intended to replace advice given to you by your health care provider. Make sure you discuss any questions you have with your health care provider. Document Released: 10/22/2003 Document Revised: 02/27/2016 Document Reviewed: 02/21/2014 Elsevier Interactive Patient Education  [employer].

## 2018-05-11 NOTE — Progress Notes (Signed)
Scott HeadingJeffrey R Garrison; [id number]; 1961/04/02   HPI Patient is a 57 year old white male who referred himself to my care for reevaluation of his abdominal wall hernias and colostomy.  In the remote past, he underwent a Hartman's procedure by another surgeon for diverticulitis.  At the second surgery, there was an attempt to reverse the colostomy, but this cannot be done.  Thus, he has a permanent colostomy.  He is also developed multiple incisional hernias on his abdominal wall.  He does not have a primary care physician.  He denies any abdominal pain.  His colostomy is working well.  He needed to be evaluated as he needs a prescription for his colostomy supplies. Past Medical History:  Diagnosis Date  . Diverticulitis     Past Surgical History:  Procedure Laterality Date  . COLOSTOMY    . HERNIA REPAIR    . TONSILLECTOMY      History reviewed. No pertinent family history.  Current Outpatient Medications on File Prior to Visit  Medication Sig Dispense Refill  . cyclobenzaprine (FLEXERIL) 10 MG tablet Take 1 tablet (10 mg total) by mouth 3 (three) times daily. (Patient not taking: Reported on 11/09/2016) 20 tablet 0  . ondansetron (ZOFRAN ODT) 4 MG disintegrating tablet Take 1 tablet (4 mg total) by mouth every 8 (eight) hours as needed for nausea or vomiting. 10 tablet 0  . ondansetron (ZOFRAN ODT) 4 MG disintegrating tablet Take 1 tablet (4 mg total) by mouth every 8 (eight) hours as needed for nausea or vomiting. 10 tablet 0  . traMADol (ULTRAM) 50 MG tablet Take 1 tablet (50 mg total) by mouth every 6 (six) hours as needed. (Patient not taking: Reported on 11/09/2016) 15 tablet 0   No current facility-administered medications on file prior to visit.     No Known Allergies  Social History   Substance and Sexual Activity  Alcohol Use Yes   Comment: socially    Social History   Tobacco Use  Smoking Status Never Smoker  Smokeless Tobacco Never Used    Review of Systems   Constitutional: Negative.   HENT: Negative.   Eyes: Negative.   Respiratory: Negative.   Cardiovascular: Negative.   Gastrointestinal: Negative.   Genitourinary: Positive for frequency.  Musculoskeletal: Positive for back pain and joint pain.  Skin: Negative.   Neurological: Negative.   Endo/Heme/Allergies: Negative.   Psychiatric/Behavioral: Negative.     Objective   Vitals:   05/10/18 1346  BP: 126/78  Pulse: 72  Temp: 97.7 F (36.5 C)    Physical Exam  Constitutional: He is oriented to person, place, and time. He appears well-developed and well-nourished. No distress.  HENT:  Head: Normocephalic and atraumatic.  Cardiovascular: Normal rate, regular rhythm and normal heart sounds. Exam reveals no gallop and no friction rub.  No murmur heard. Pulmonary/Chest: Effort normal and breath sounds normal. No stridor. No respiratory distress. He has no wheezes. He has no rales.  Abdominal: Soft. Bowel sounds are normal. He exhibits no distension and no mass. There is no tenderness. There is no guarding. A hernia is present.  Colostomy is present along the left side of the abdomen with significant fascial weakness noted.  He has a healed wide midline incision with minimal skin overlying the abdominal cavity.  A hernia is present along the incision as well as along the right lateral aspect of the abdomen.  All hernias are wide and easily reducible.  He does have significant pannus present.  Neurological: He is alert and  oriented to person, place, and time.  Skin: Skin is warm and dry.  Vitals reviewed.   Assessment  Status post Hartman's procedure with permanent colostomy, multiple incisional hernias Plan   I did write him a prescription for refill of his colostomy supplies.  I strongly encouraged him to find a primary care physician.  He understands and agrees.  No need for hernia repair at this time.  Follow-up as needed.

## 2018-05-18 DIAGNOSIS — Z933 Colostomy status: Secondary | ICD-10-CM | POA: Diagnosis not present

## 2018-05-18 DIAGNOSIS — K5732 Diverticulitis of large intestine without perforation or abscess without bleeding: Secondary | ICD-10-CM | POA: Diagnosis not present

## 2018-06-07 DIAGNOSIS — F10280 Alcohol dependence with alcohol-induced anxiety disorder: Secondary | ICD-10-CM | POA: Diagnosis not present

## 2018-06-07 DIAGNOSIS — K5792 Diverticulitis of intestine, part unspecified, without perforation or abscess without bleeding: Secondary | ICD-10-CM | POA: Diagnosis not present

## 2018-06-07 DIAGNOSIS — F1210 Cannabis abuse, uncomplicated: Secondary | ICD-10-CM | POA: Diagnosis not present

## 2018-06-07 DIAGNOSIS — Z933 Colostomy status: Secondary | ICD-10-CM | POA: Diagnosis not present

## 2018-06-07 DIAGNOSIS — Z634 Disappearance and death of family member: Secondary | ICD-10-CM | POA: Diagnosis not present

## 2018-06-07 DIAGNOSIS — F1020 Alcohol dependence, uncomplicated: Secondary | ICD-10-CM | POA: Diagnosis not present

## 2018-06-10 ENCOUNTER — Telehealth (HOSPITAL_COMMUNITY): Payer: Self-pay | Admitting: Psychology

## 2018-06-17 ENCOUNTER — Telehealth (HOSPITAL_COMMUNITY): Payer: Self-pay | Admitting: Psychology

## 2020-09-19 DIAGNOSIS — I8390 Asymptomatic varicose veins of unspecified lower extremity: Secondary | ICD-10-CM | POA: Diagnosis not present

## 2020-09-19 DIAGNOSIS — F1020 Alcohol dependence, uncomplicated: Secondary | ICD-10-CM | POA: Diagnosis not present

## 2020-09-19 DIAGNOSIS — F32A Depression, unspecified: Secondary | ICD-10-CM | POA: Diagnosis not present

## 2020-09-19 DIAGNOSIS — Z23 Encounter for immunization: Secondary | ICD-10-CM | POA: Diagnosis not present

## 2020-09-19 DIAGNOSIS — F419 Anxiety disorder, unspecified: Secondary | ICD-10-CM | POA: Diagnosis not present

## 2020-09-19 DIAGNOSIS — D519 Vitamin B12 deficiency anemia, unspecified: Secondary | ICD-10-CM | POA: Diagnosis not present

## 2020-09-19 DIAGNOSIS — Z9049 Acquired absence of other specified parts of digestive tract: Secondary | ICD-10-CM | POA: Diagnosis not present

## 2020-09-19 DIAGNOSIS — Z6832 Body mass index (BMI) 32.0-32.9, adult: Secondary | ICD-10-CM | POA: Diagnosis not present

## 2020-09-19 DIAGNOSIS — Z939 Artificial opening status, unspecified: Secondary | ICD-10-CM | POA: Diagnosis not present

## 2020-09-20 DIAGNOSIS — I8392 Asymptomatic varicose veins of left lower extremity: Secondary | ICD-10-CM | POA: Diagnosis not present

## 2020-09-20 DIAGNOSIS — I8390 Asymptomatic varicose veins of unspecified lower extremity: Secondary | ICD-10-CM | POA: Diagnosis not present

## 2020-09-20 DIAGNOSIS — Q2732 Arteriovenous malformation of vessel of lower limb: Secondary | ICD-10-CM | POA: Diagnosis not present

## 2020-09-30 DIAGNOSIS — Z6832 Body mass index (BMI) 32.0-32.9, adult: Secondary | ICD-10-CM | POA: Diagnosis not present

## 2020-09-30 DIAGNOSIS — F419 Anxiety disorder, unspecified: Secondary | ICD-10-CM | POA: Diagnosis not present

## 2020-09-30 DIAGNOSIS — I8390 Asymptomatic varicose veins of unspecified lower extremity: Secondary | ICD-10-CM | POA: Diagnosis not present

## 2020-09-30 DIAGNOSIS — F32A Depression, unspecified: Secondary | ICD-10-CM | POA: Diagnosis not present

## 2020-09-30 DIAGNOSIS — F1020 Alcohol dependence, uncomplicated: Secondary | ICD-10-CM | POA: Diagnosis not present

## 2020-09-30 DIAGNOSIS — Z9049 Acquired absence of other specified parts of digestive tract: Secondary | ICD-10-CM | POA: Diagnosis not present

## 2020-09-30 DIAGNOSIS — Z939 Artificial opening status, unspecified: Secondary | ICD-10-CM | POA: Diagnosis not present

## 2020-10-03 ENCOUNTER — Other Ambulatory Visit: Payer: Self-pay

## 2020-10-03 DIAGNOSIS — I8390 Asymptomatic varicose veins of unspecified lower extremity: Secondary | ICD-10-CM

## 2020-10-08 DIAGNOSIS — Z6833 Body mass index (BMI) 33.0-33.9, adult: Secondary | ICD-10-CM | POA: Diagnosis not present

## 2020-10-08 DIAGNOSIS — F419 Anxiety disorder, unspecified: Secondary | ICD-10-CM | POA: Diagnosis not present

## 2020-10-08 DIAGNOSIS — I8390 Asymptomatic varicose veins of unspecified lower extremity: Secondary | ICD-10-CM | POA: Diagnosis not present

## 2020-10-08 DIAGNOSIS — F32A Depression, unspecified: Secondary | ICD-10-CM | POA: Diagnosis not present

## 2020-10-08 DIAGNOSIS — Z939 Artificial opening status, unspecified: Secondary | ICD-10-CM | POA: Diagnosis not present

## 2020-10-08 DIAGNOSIS — Z9049 Acquired absence of other specified parts of digestive tract: Secondary | ICD-10-CM | POA: Diagnosis not present

## 2020-10-08 DIAGNOSIS — F1020 Alcohol dependence, uncomplicated: Secondary | ICD-10-CM | POA: Diagnosis not present

## 2020-10-09 ENCOUNTER — Ambulatory Visit (HOSPITAL_COMMUNITY)
Admission: RE | Admit: 2020-10-09 | Discharge: 2020-10-09 | Disposition: A | Discharge status: Home / Self Care | Payer: Medicare HMO | Source: Ambulatory Visit | Attending: Vascular Surgery | Admitting: Vascular Surgery

## 2020-10-09 ENCOUNTER — Encounter: Payer: Self-pay | Admitting: Physician Assistant

## 2020-10-09 ENCOUNTER — Ambulatory Visit (INDEPENDENT_AMBULATORY_CARE_PROVIDER_SITE_OTHER): Payer: Medicare HMO | Admitting: Physician Assistant

## 2020-10-09 ENCOUNTER — Other Ambulatory Visit: Payer: Self-pay

## 2020-10-09 VITALS — BP 109/67 | HR 64 | Temp 98.7°F | Resp 20 | Ht 71.0 in | Wt 212.3 lb

## 2020-10-09 DIAGNOSIS — I8393 Asymptomatic varicose veins of bilateral lower extremities: Secondary | ICD-10-CM

## 2020-10-09 DIAGNOSIS — I8390 Asymptomatic varicose veins of unspecified lower extremity: Secondary | ICD-10-CM | POA: Diagnosis not present

## 2020-10-09 DIAGNOSIS — I872 Venous insufficiency (chronic) (peripheral): Secondary | ICD-10-CM

## 2020-10-09 NOTE — Progress Notes (Signed)
Requested by:  Donetta Potts, MD [address] [place],  [place] [address]  Reason for consultation: varicosities of left leg   History of Present Illness   Scott Garrison is a 59 y.o. (1961/09/28) male who presents for evaluation of varicosities of left leg. He says that they have probably been present for around 2 years. Feels that they have continued to get worse and he is having worsening burning and pinching and heaviness in the left leg. He also has areas that are very itchy and finds that he wakes up scratching them in the middle of the night. He also has swelling in the left greater than right leg especially after being up on his legs all day. 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 He says by the end of the day he has to go straight home and get into bed and elevate his legs. He has relief with elevation. He has never worn compression stockings in the past. He does not have any history of DVT. No family hx of DVT or venous disease  Venous symptoms include: heavy, tired, burning, pinching, itching, swelling Onset/duration: > 2 years Occupation:  Pressure washing Aggravating factors: ambulation, standing Alleviating factors: elevation Compression:  No Helps:  n/a Pain medications: no Previous vein procedures:  no History of DVT:  no  Past Medical History:  Diagnosis Date  . Diverticulitis     Past Surgical History:  Procedure Laterality Date  . COLOSTOMY    . HERNIA REPAIR    . TONSILLECTOMY      Social History   Socioeconomic History  . Marital status: Widowed    Spouse name: Not on file  . Number of children: Not on file  . Years of education: Not on file  . Highest education level: Not on file  Occupational History  . Not on file  Tobacco Use  . Smoking status: Never Smoker  .  Smokeless tobacco: Never Used  Vaping Use  . Vaping Use: Never used  Substance and Sexual Activity  . Alcohol use: Yes    Comment: socially  . Drug use: No  . Sexual activity: Not on file  Other Topics Concern  . Not on file  Social History Narrative  . Not on file   Social Determinants of Health   Financial Resource Strain:   . Difficulty of Paying Living Expenses: Not on file  Food Insecurity:   . Worried About Programme researcher, broadcasting/film/video in the Last Year: Not on file  . Ran Out of Food in the Last Year: Not on file  Transportation Needs:   . Lack of Transportation (Medical): Not on file  . Lack of Transportation (Non-Medical): Not on file  Physical Activity:   . Days of Exercise per Week: Not on file  . Minutes of Exercise per Session: Not on file  Stress:   . Feeling of Stress : Not on file  Social Connections:   . Frequency of Communication with Friends and Family: Not on file  . Frequency of Social Gatherings with Friends and Family: Not on file  . Attends Religious Services: Not on file  . Active Member of Clubs or Organizations: Not on file  . Attends Banker Meetings: Not on file  . Marital Status: Not on file  Intimate Partner  Violence:   . Fear of Current or Ex-Partner: Not on file  . Emotionally Abused: Not on file  . Physically Abused: Not on file  . Sexually Abused: Not on file   No family history on file.  Current Outpatient Medications  Medication Sig Dispense Refill  . ESCITALOPRAM OXALATE PO Take by mouth.     No current facility-administered medications for this visit.    No Known Allergies  REVIEW OF SYSTEMS (negative unless checked):   Cardiac:  []  Chest pain or chest pressure? []  Shortness of breath upon activity? []  Shortness of breath when lying flat? []  Irregular heart rhythm?  Vascular:  []  Pain in calf, thigh, or hip brought on by walking? []  Pain in feet at night that wakes you up from your sleep? []  Blood clot in your  veins? []  Leg swelling?  Pulmonary:  []  Oxygen at home? []  Productive cough? []  Wheezing?  Neurologic:  []  Sudden weakness in arms or legs? []  Sudden numbness in arms or legs? []  Sudden onset of difficult speaking or slurred speech? []  Temporary loss of vision in one eye? []  Problems with dizziness?  Gastrointestinal:  []  Blood in stool? []  Vomited blood?  Genitourinary:  []  Burning when urinating? []  Blood in urine?  Psychiatric:  []  Major depression  Hematologic:  []  Bleeding problems? []  Problems with blood clotting?  Dermatologic:  []  Rashes or ulcers?  Constitutional:  []  Fever or chills?  Ear/Nose/Throat:  []  Change in hearing? []  Nose bleeds? []  Sore throat?  Musculoskeletal:  []  Back pain? []  Joint pain? []  Muscle pain?   Physical Examination     Vitals:   10/09/20 1346  BP: 109/67  Pulse: 64  Resp: 20  Temp: 98.7 F (37.1 C)  TempSrc: Temporal  SpO2: 100%  Weight: 212 lb 4.8 oz (96.3 kg)  Height: 5\' 11"  (1.803 m)   Body mass index is 29.61 kg/m.  General:  WDWN in NAD; vital signs documented above Gait: Normal HENT: WNL, normocephalic Pulmonary: normal non-labored breathing Cardiac: regular HR Abdomen: has colostomy and multiple hernias Skin: venous dermatitis with macular scaly rashes of left leg with excoriations Vascular Exam/Pulses:  Right Left  Radial 2+ (normal) 2+ (normal)  Femoral 2+ (normal) 2+ (normal)  Popliteal 2+ (normal) 2+ (normal)  DP 2+ (normal) 2+ (normal)  PT 2+ (normal) 2+ (normal)   Extremities: with varicose veins, with reticular veins, with edema, without stasis pigmentation, without lipodermatosclerosis, without ulcers    Cluster of varicose veins on the medial proximal left leg as well as on the lateral mid leg. He also has several scattered reticular veins on the leg and some venous dermatitis Musculoskeletal: no muscle wasting or atrophy  Neurologic: A&O X 3;  No focal weakness or paresthesias  are detected Psychiatric:  The pt has Normal affect.  Non-invasive Vascular Imaging   BLE Venous Insufficiency Duplex (10/09/20):    LLE:  No DVT and SVT  GSV reflux proximal calf to SFJ  GSV diameter 0.769-0.488  No SSV reflux ; chronic occlusion of the SSV  CFV, FV, popliteal deep venous reflux  Dilation of the FV and gastroc vein   Medical Decision Making   TAITE SCHOEPPNER is a 59 y.o. male who presents with: LLE chronic venous insufficiency with large varicose veins CEAP C3/C4. His duplex examination shows deep and superficial venous insufficiency. He also has chronic SSV thrombus. He has no DVT on duplex. His veins are very large of of adequate diameter for possible ablation.  I discussed with patient that he would likely benefit from lazer ablation or other further venous procedures to prevent further complications such as bleeding, skin changes, ulceration and pain.   Based on the patient's history and examination, I recommend daily elevation, compression stockings, weight loss, and exercise  I discussed with the patient the use of her 20-30 mm thigh high compression stockings and need for 3 month trial of such.  The patient will follow up in 3 months with Dr. Darrick Penna or Dr. Edilia Bo  Thank you for allowing [place] to participate in this patient's care.   Graceann Congress, PA-C [facility] Office: [phone]  10/09/2020, 2:13 PM  Clinic MD:Dr. Randie Heinz

## 2020-10-15 DIAGNOSIS — Z23 Encounter for immunization: Secondary | ICD-10-CM | POA: Diagnosis not present

## 2020-11-11 DIAGNOSIS — Z9049 Acquired absence of other specified parts of digestive tract: Secondary | ICD-10-CM | POA: Diagnosis not present

## 2020-11-11 DIAGNOSIS — F419 Anxiety disorder, unspecified: Secondary | ICD-10-CM | POA: Diagnosis not present

## 2020-11-11 DIAGNOSIS — F1020 Alcohol dependence, uncomplicated: Secondary | ICD-10-CM | POA: Diagnosis not present

## 2020-11-11 DIAGNOSIS — F32A Depression, unspecified: Secondary | ICD-10-CM | POA: Diagnosis not present

## 2020-11-11 DIAGNOSIS — Z939 Artificial opening status, unspecified: Secondary | ICD-10-CM | POA: Diagnosis not present

## 2020-11-11 DIAGNOSIS — I8390 Asymptomatic varicose veins of unspecified lower extremity: Secondary | ICD-10-CM | POA: Diagnosis not present

## 2020-11-12 DIAGNOSIS — F419 Anxiety disorder, unspecified: Secondary | ICD-10-CM | POA: Diagnosis not present

## 2020-11-12 DIAGNOSIS — F1020 Alcohol dependence, uncomplicated: Secondary | ICD-10-CM | POA: Diagnosis not present

## 2020-11-12 DIAGNOSIS — Z9049 Acquired absence of other specified parts of digestive tract: Secondary | ICD-10-CM | POA: Diagnosis not present

## 2020-11-12 DIAGNOSIS — Z939 Artificial opening status, unspecified: Secondary | ICD-10-CM | POA: Diagnosis not present

## 2020-11-12 DIAGNOSIS — Z0001 Encounter for general adult medical examination with abnormal findings: Secondary | ICD-10-CM | POA: Diagnosis not present

## 2020-11-12 DIAGNOSIS — I8390 Asymptomatic varicose veins of unspecified lower extremity: Secondary | ICD-10-CM | POA: Diagnosis not present

## 2021-01-09 ENCOUNTER — Ambulatory Visit: Payer: Medicare Other | Admitting: Vascular Surgery

## 2021-02-05 ENCOUNTER — Encounter: Payer: Self-pay | Admitting: Vascular Surgery

## 2021-11-06 DIAGNOSIS — R6889 Other general symptoms and signs: Secondary | ICD-10-CM | POA: Diagnosis not present

## 2021-11-06 DIAGNOSIS — Z136 Encounter for screening for cardiovascular disorders: Secondary | ICD-10-CM | POA: Diagnosis not present

## 2021-11-06 DIAGNOSIS — R42 Dizziness and giddiness: Secondary | ICD-10-CM | POA: Diagnosis not present

## 2021-11-06 DIAGNOSIS — R739 Hyperglycemia, unspecified: Secondary | ICD-10-CM | POA: Diagnosis not present

## 2021-11-12 DIAGNOSIS — H2513 Age-related nuclear cataract, bilateral: Secondary | ICD-10-CM | POA: Diagnosis not present

## 2021-11-27 DIAGNOSIS — R2681 Unsteadiness on feet: Secondary | ICD-10-CM | POA: Diagnosis not present

## 2021-11-27 DIAGNOSIS — S060X0A Concussion without loss of consciousness, initial encounter: Secondary | ICD-10-CM | POA: Diagnosis not present

## 2021-11-27 DIAGNOSIS — W19XXXA Unspecified fall, initial encounter: Secondary | ICD-10-CM | POA: Diagnosis not present
